# Patient Record
Sex: Male | Born: 1955 | Hispanic: Refuse to answer | Marital: Single
Health system: Southern US, Community
[De-identification: ages and names within clinical notes are randomized; demographics above are authoritative.]

---

## 2008-09-25 ENCOUNTER — Inpatient Hospital Stay (HOSPITAL_COMMUNITY): Admission: EM | Admit: 2008-09-25 | Discharge: 2008-10-01 | Payer: Self-pay | Admitting: Emergency Medicine

## 2008-09-27 ENCOUNTER — Other Ambulatory Visit: Payer: Self-pay | Admitting: Family Medicine

## 2010-08-04 LAB — BASIC METABOLIC PANEL
BUN: 7 mg/dL (ref 6–23)
BUN: 8 mg/dL (ref 6–23)
CO2: 27 mEq/L (ref 19–32)
CO2: 27 mEq/L (ref 19–32)
Calcium: 8.3 mg/dL — ABNORMAL LOW (ref 8.4–10.5)
Calcium: 8.8 mg/dL (ref 8.4–10.5)
Calcium: 8.8 mg/dL (ref 8.4–10.5)
Calcium: 8.9 mg/dL (ref 8.4–10.5)
Chloride: 101 mEq/L (ref 96–112)
Creatinine, Ser: 0.94 mg/dL (ref 0.4–1.5)
Creatinine, Ser: 0.98 mg/dL (ref 0.4–1.5)
Creatinine, Ser: 1.03 mg/dL (ref 0.4–1.5)
GFR calc Af Amer: >60 mL/min (ref >60)
GFR calc Af Amer: >60 mL/min (ref >60)
GFR calc Af Amer: >60 mL/min (ref >60)
GFR calc non Af Amer: >60 mL/min (ref >60)
GFR calc non Af Amer: >60 mL/min (ref >60)
Glucose, Bld: 113 mg/dL — ABNORMAL HIGH (ref 70–99)
Potassium: 3.4 mEq/L — ABNORMAL LOW (ref 3.5–5.1)
Sodium: 136 mEq/L (ref 135–145)
Sodium: 140 mEq/L (ref 135–145)

## 2010-08-04 LAB — CULTURE, BLOOD (ROUTINE X 2): Culture: NO GROWTH

## 2010-08-04 LAB — DIFFERENTIAL
Basophils Absolute: 0 10*3/uL (ref 0.0–0.1)
Basophils Absolute: 0 10*3/uL (ref 0.0–0.1)
Basophils Absolute: 0 10*3/uL (ref 0.0–0.1)
Basophils Relative: 0 % (ref 0–1)
Basophils Relative: 0 % (ref 0–1)
Basophils Relative: 0 % (ref 0–1)
Eosinophils Absolute: 0.1 10*3/uL (ref 0.0–0.7)
Eosinophils Absolute: 0.1 10*3/uL (ref 0.0–0.7)
Eosinophils Absolute: 0.3 10*3/uL (ref 0.0–0.7)
Eosinophils Relative: 2 % (ref 0–5)
Eosinophils Relative: 4 % (ref 0–5)
Lymphs Abs: 2.4 10*3/uL (ref 0.7–4.0)
Monocytes Absolute: 0.9 10*3/uL (ref 0.1–1.0)
Monocytes Absolute: 1.5 10*3/uL — ABNORMAL HIGH (ref 0.1–1.0)
Monocytes Relative: 9 % (ref 3–12)
Monocytes Relative: 9 % (ref 3–12)
Neutro Abs: 14 10*3/uL — ABNORMAL HIGH (ref 1.7–7.7)
Neutro Abs: 4.1 10*3/uL (ref 1.7–7.7)
Neutro Abs: 8.7 10*3/uL — ABNORMAL HIGH (ref 1.7–7.7)
Neutrophils Relative %: 65 % (ref 43–77)
Neutrophils Relative %: 75 % (ref 43–77)

## 2010-08-04 LAB — CBC
HCT: 30.7 % — ABNORMAL LOW (ref 39.0–52.0)
HCT: 33.1 % — ABNORMAL LOW (ref 39.0–52.0)
Hemoglobin: 10.1 g/dL — ABNORMAL LOW (ref 13.0–17.0)
Hemoglobin: 11.3 g/dL — ABNORMAL LOW (ref 13.0–17.0)
Hemoglobin: 12.7 g/dL — ABNORMAL LOW (ref 13.0–17.0)
MCHC: 33.5 g/dL (ref 30.0–36.0)
MCHC: 33.6 g/dL (ref 30.0–36.0)
MCHC: 34 g/dL (ref 30.0–36.0)
MCV: 90.8 fL (ref 78.0–100.0)
MCV: 91.8 fL (ref 78.0–100.0)
MCV: 91.8 fL (ref 78.0–100.0)
RBC: 3.34 MIL/uL — ABNORMAL LOW (ref 4.22–5.81)
RBC: 3.53 MIL/uL — ABNORMAL LOW (ref 4.22–5.81)
RBC: 3.62 MIL/uL — ABNORMAL LOW (ref 4.22–5.81)
RBC: 4.13 MIL/uL — ABNORMAL LOW (ref 4.22–5.81)
RDW: 14.4 % (ref 11.5–15.5)
RDW: 14.4 % (ref 11.5–15.5)
RDW: 14.8 % (ref 11.5–15.5)
RDW: 15.2 % (ref 11.5–15.5)
WBC: 10.1 10*3/uL (ref 4.0–10.5)
WBC: 14.6 10*3/uL — ABNORMAL HIGH (ref 4.0–10.5)

## 2010-08-04 LAB — CULTURE, ROUTINE-ABSCESS

## 2010-08-04 LAB — POCT I-STAT, CHEM 8
Chloride: 103 mEq/L (ref 96–112)
HCT: 40 % (ref 39.0–52.0)
Hemoglobin: 13.6 g/dL (ref 13.0–17.0)
Potassium: 3.7 mEq/L (ref 3.5–5.1)
Sodium: 142 mEq/L (ref 135–145)

## 2010-08-04 LAB — COMPREHENSIVE METABOLIC PANEL
ALT: 71 U/L — ABNORMAL HIGH (ref 0–53)
Alkaline Phosphatase: 75 U/L (ref 39–117)
CO2: 28 mEq/L (ref 19–32)
Chloride: 108 mEq/L (ref 96–112)
Glucose, Bld: 124 mg/dL — ABNORMAL HIGH (ref 70–99)
Potassium: 3.8 mEq/L (ref 3.5–5.1)
Sodium: 143 mEq/L (ref 135–145)
Total Bilirubin: 1.3 mg/dL — ABNORMAL HIGH (ref 0.3–1.2)
Total Protein: 6.4 g/dL (ref 6.0–8.3)

## 2010-08-04 LAB — ANAEROBIC CULTURE

## 2010-09-09 NOTE — H&P (Signed)
Randy Pierce, Randy Pierce NO.:  [id number]   MEDICAL RECORD NO.:  [id number]          PATIENT TYPE:  INP   LOCATION:  2610                         FACILITY:  [facility]   PHYSICIAN:  Lonia Blood, M.D.       DATE OF BIRTH:  03-04-1956   DATE OF ADMISSION:  09/25/2008  DATE OF DISCHARGE:                              HISTORY & PHYSICAL   PRIMARY CARE PHYSICIAN:  Dr. Pasty Arch.   CHIEF COMPLAINT:  Right jaw swelling.   HISTORY OF PRESENT ILLNESS:  [name] is a 55 year old gentleman  without any significant past medical history who presents with 24 hours  of worsening right facial swelling.  Everything started with a toothache  over the weekend and the patient saw his dentist who prescribed  cephalexin.  This morning though his jaw was extremely swollen and the  patient was found to be tachycardic, so he was sent over here for  admission.   PAST MEDICAL HISTORY:  1. Hyperlipidemia.  2. Gout.  3. Hypertension.   HOME MEDICATIONS:  Norvasc, lisinopril, simvastatin, and allopurinol.   SOCIAL HISTORY:  The patient is single.  No tobacco, alcohol, or drugs.  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   FAMILY HISTORY:  Mother is deceased with congestive heart failure.  Father died with heart attack.   REVIEW OF SYSTEMS:  Positive for some fevers, chills.  All other systems  reviewed and negative.   PHYSICAL EXAMINATION:  VITAL SIGNS:  Upon admission, temperature 100.8,  heart rate 123, respiratory rate 18, blood pressure 125/82, saturation  96% on room air.  GENERAL:  The patient is awake, alert, in no acute distress.  HEENT:  His right side of the face has significant edema and erythema.  No pus draining.  There is erythema extending on the right side of the  neck.  There is no significant tenderness or fluctuation appreciated.  Throat seems to be clear.   CARDIOVASCULAR:  Tachycardic and regular without lower extremity edema.  RESPIRATORY:  Clear to  auscultation without wheeze, rhonchi, or  crackles.  ABDOMEN:  Soft, nontender, nondistended.  Bowel sounds are present.  LOWER EXTREMITIES:  Without edema.  PSYCHIATRIC:  Alert and oriented, pleasant.   LABORATORY VALUES ON ADMISSION:  White blood cell count 17,000,  hemoglobin 12, platelet count 228.  Sodium 142, potassium 3.7, chloride  102, bicarbonate 27, BUN 14, creatinine 1.2, glucose 117.  CT scan of  the face shows extensive cellulitis with a 1.7-cm abscess in the  masseter muscle.   ASSESSMENT/PLAN:  1. This is a 55 year old gentleman with tachycardia, leukocytosis,      masseter muscle abscess, and cellulitis, probably with a point of      entry being the right molar abscess.  The patient will be started      on intravenous fluids, intravenous Unasyn, and consultation with      Dr. Narda Bonds has been called for consideration of incision and      drainage.  The patient will be observed in step-down unit overnight      to assure that he  does not develop sepsis.  2. Hypertension.  For now, we will hold his medications until his      blood pressure becomes more stable.  3. Hyperlipidemia.  We will continue statin.      Lonia Blood, M.D.  Electronically Signed     SL/MEDQ  D:  09/25/2008  T:  09/26/2008  Job:  [id number]   cc:   Pasty Arch, MD

## 2010-09-09 NOTE — Op Note (Signed)
NAME:  Randy Pierce, Randy Pierce NO.:  [id number]   MEDICAL RECORD NO.:  [id number]          PATIENT TYPE:  INP   LOCATION:  5002                         FACILITY:  [facility]   PHYSICIAN:  Grant Ruts., D.D.S.DATE OF BIRTH:  1955/06/25   DATE OF PROCEDURE:  09/27/2008  DATE OF DISCHARGE:                               OPERATIVE REPORT   PREOPERATIVE DIAGNOSIS:  Right submandibular space and masseteric space  abscess secondary to dental abscess of tooth #31.   POSTOPERATIVE DIAGNOSIS:  Right submandibular space and masseteric space  abscess secondary to dental abscess of tooth #31.   TYPE OF PROCEDURE:  Right submandibular and masseteric space extraoral  incision and drainage and placement of Penrose drain, culture  sensitivity, and extraction of tooth #31.   SURGEON:  Gwendlyn Deutscher, DDS   INDICATION FOR PROCEDURE:  This 55 year old male who had suddenly  developed swelling in his right face and right submandibular area  approximately 4 days ago.  He was admitted to the [facility] hospital on September 25, 2008, and started on IV antibiotics, Unasyn and had showed some mild  improvement.  X-ray examination showed he had right submandibular and  right masseteric space abscess and the cellulitis secondary to grossly  carious tooth #31 and a periapical radiolucency and abscess of tooth  #31.  A treatment plan was formulated for removal of tooth #31 and  incision and drainage extraorally.   PROCEDURE AND FINDINGS:  The patient was brought to the operating suite.  He was sedated with Fentanyl and Versed.  After satisfactory level of  sedation, the right submandibular area was prepped with Betadine scrub  and Betadine paint, and the patient with  draped with 4 towels, split sheet, and a head sheet.  Using 2% Xylocaine  with 1:100,000 epinephrine, right submandibular and right inferior  alveolar and lingual nerve block was achieved with local anesthesia and  then infiltration in the  right submandibular area with 2% Xylocaine with  1:100,000 epinephrine.  The right cheek was extended in right  submandibular space in the most dependent area and incision was made  approximately 1-1/2 to 2 inches long to the skin and subcutaneous  tissue, very moderate amount of bleeding was noted.  A hemostat was then  used to bluntly dissect superiorly and medially to locate the body of  the mandible and a pocket of purulent exudate was encountered and  copious amount of purulent exudate was elicited from the wound.  A  culture was taken for anaerobic and aerobic cultures and sent to the lab  for analysis and sensitivity.   The submandibular wound was dissected up to the lateral border of the  mandible and proximal area of the tooth #31 and then dissection was  carried out on the medial aspect of the mandible using blunt dissection.  The wound was thoroughly irrigated with a Betadine solution followed by  saline solution.  A 0.25-inch Penrose drain was then placed in the  incision on the lateral border of the mandible and sutured to the wound  edge extra-orally with 3-0 silk suture.  Bleeding was controlled with  compression and gauze.   Attention was then carried out infra-orally and using a 15 scalpel  blade, incision was made in the gingival tissue associated with tooth  #31.  A full-thickness mucoperiosteal flap was elevated and then using a  151 forceps, tooth #31 was surgically removed.  The tooth was noted to  be grossly carious and the entire tooth was removed without  complication.   After removal of the tooth, the socket was thoroughly irrigated with  saline solution and the wound was closed with a 3-0 gut suture.  The  socket was left open for drainage in this area.   Using two gauze folded over, the bleeding was easily controlled and the  area was packed.  A dressing was placed extra-orally and minimal  bleeding was noted at the end of the procedure.  Additional gauze  were  placed over the extraoral incision and drainage and a dressing was  placed.   TISSUE REMOVED:  Tooth #31.   DRAINS:  A 0.25-inch Penrose drain placed at the right submandibular  area.  Suture was gut suture.   CULTURES:  Anaerobic and aerobic cultures of the wound were completed  and of the abscess.   CONDITION:  The patient was taken to recovery room in stable condition,  tolerating the surgery and the sedation and local anesthesia well.      Grant Ruts., D.D.S.  Electronically Signed     WB/MEDQ  D:  09/27/2008  T:  09/28/2008  Job:  [id number]

## 2010-09-09 NOTE — Consult Note (Signed)
NAME:  Randy Pierce, Randy Pierce NO.:  [id number]   MEDICAL RECORD NO.:  [id number]          PATIENT TYPE:  INP   LOCATION:  5002                         FACILITY:  [facility]   PHYSICIAN:  Kristine Garbe. Ezzard Standing, M.D.DATE OF BIRTH:  Aug 20, 1955   DATE OF CONSULTATION:  09/26/2008  DATE OF DISCHARGE:                                 CONSULTATION   REASON FOR CONSULT:  Evaluate the patient with right facial abscess.   BRIEF HISTORY:  Mr. Torrence is a 55 year old gentleman who is admitted  via the emergency room with a right masseter muscle abscess infection.  This began initially last week with some pain in the right lower wisdom  tooth region.  He was started on antibiotics and was seen by a local  dentist to consider extraction of the tooth.  But when he presented to  dentist, he had diffuse swelling of the face and was referred to the  emergency room.  In the emergency room at [facility], the patient had a CT  scan which showed diffuse cellulitis of the right facial area as well as  cellulitis and small abscesses involving the right masseter muscle.  On  examination, the patient has a moderate trismus and he has diffuse  swelling of the right side of his face.   IMPRESSION:  Microabscesses of the right masseter muscle originating  from dental infection of the right lower wisdom tooth.  Reviewed the CT  scan with the radiologist.  He is not sure if there is anything that is  definite to drain at this point.  Would recommend intravenous  antibiotics cover intraoral infection to include anaerobic infection.  If the patient does not have significant improvement after 48 hours of  intravenous antibiotics, would repeat the CT scan to see if this abscess  consolidates or enlarges and if this worsens plan intraoral drainage.  However, the patient would also benefit from oral surgery consult to see  if extraction of the wisdom tooth when this will begin may help relieve  and decompress the  infection along with intravenous antibiotics.  Discussed plan of care with Dr. Lavera Guise who will contact an oral surgeon  for evaluation of possible dental extraction.  We will follow up in the  next 24 hours for recheck.           ______________________________  Kristine Garbe. Ezzard Standing, M.D.     CEN/MEDQ  D:  09/26/2008  T:  09/27/2008  Job:  [id number]

## 2010-09-09 NOTE — Discharge Summary (Signed)
NAME:  Randy Pierce, Randy Pierce NO.:  [id number]   MEDICAL RECORD NO.:  [id number]          PATIENT TYPE:  INP   LOCATION:  5002                         FACILITY:  [facility]   PHYSICIAN:  Ramiro Harvest, MD    DATE OF BIRTH:  1955-05-16   DATE OF ADMISSION:  09/25/2008  DATE OF DISCHARGE:  10/01/2008                               DISCHARGE SUMMARY   PRIMARY CARE PHYSICIAN:  Chales Salmon. Abigail Miyamoto, M.D. of [facility].   DISCHARGE DIAGNOSES:  1. A right submandibular space and mesenteric space abscess secondary      to a dental abscess of the tooth status post I&D and placement of      Penrose drain and extraction of tooth number 31 on September 27, 2008.  2. Tachycardia secondary to #1, resolved.  3. Hypernatremia, resolved.  4. Hypertension.  5. Gout.  6. History of hyperlipidemia.   DISCHARGE MEDICATIONS:  1. Allopurinol 300 mg p.o. daily.  2. Lisinopril 20 mg p.o. daily.  3. Simvastatin 80 mg p.o. daily.  4. Vicodin 5/500 one tablet p.o. q.4h. p.r.n. as previously taken.  5. Norvasc 5 mg p.o. daily.   DISPOSITION AND FOLLOW UP:  The patient will be discharged home.  The  patient is to follow up with Dr. Manson Passey as soon as he is discharged from  the hospital for further evaluation and follow-up, and it will be  decided per oral surgery as to whether the patient needs any further  antibiotics.  Cultures will need to be followed up upon per oral  surgeon.   CONSULTATIONS DONE:  1. ENT consult was done.  The patient was seen in consultation by Dr.      Ezzard Standing on September 26, 2008.  2. Oral surgery consult was done.  The patient was seen by Dr. Gwendlyn Deutscher, Montez Hageman. on September 27, 2008.   PROCEDURES PERFORMED:  1. CT of maxillofacial was done on September 25, 2008 which showed extensive      swelling of the right face compatible with infection.  There is      enlargement of the right mass of the muscle with several areas of      abscess in the right masseter muscle.  This is most  likely due to      dental infection.  There is lucency around the right upper molar      which may be the source of infection.  An orthopantogram was done      on September 26, 2008 that showed caries and peri root lucency affecting      the remaining right mandibular molar.  A right submandibular      mesenteric space extraoral incision and drainage and placement of      Penrose drain. culture and sensitivity, and extraction of tooth #31      was done on September 27, 2008 by Dr. Gwendlyn Deutscher, Montez Hageman.   BRIEF ADMISSION HISTORY AND PHYSICAL:  [name] is a 55-year-  old gentleman without any significant past medical history who presented  with 24 hours of worsening right facial swelling.  Everything started  with a toothache over the weekend, and the patient saw Dr. Renato Gails who had  given him some Keflex.  On the morning of admission, the patient's jaw  was extremely swollen, and the patient was found to be tachycardiac, so  he was sent over to the ED for admission.   PHYSICAL EXAMINATION PER ADMITTING PHYSICIAN:  VITAL SIGNS:  Temperature  of 100.8, heart rate of 123, respiratory rate 18, blood pressure 125/82,  satting 96% on room air.  GENERAL:  The patient is awake, alert, in no acute distress.  HEENT:  Right side of the face with significant edema and erythema.  No  pus is draining.  There is erythema extending on the right side of the  neck.  There is no significant tenderness or fluctuation appreciated.  Throat seems clear.  CARDIOVASCULAR:  Tachycardiac, regular without lower extremity edema.  RESPIRATORY:  Lungs are clear to auscultation without wheeze, rhonchi or  crackles.  ABDOMEN:  Soft, nontender, nondistended, positive bowel sounds.  EXTREMITIES:  No clubbing, cyanosis or edema.  NEUROLOGIC:  The patient is alert and oriented x3.  Cranial nerves II-  XII are grossly intact.  No focal deficits.   ADMISSION LABORATORIES:  CBC - white count 17,000, hemoglobin 12,  platelet  count of 228, sodium of 142, potassium 3.7, chloride 102,  bicarbonate 27, BUN 14, creatinine 1.2, glucose 117.  CT as stated  above.   HOSPITAL COURSE:  1. Right submandibular space and mesenteric space abscess secondary to      dental abscess of the tooth.  The patient was admitted initially      with this right facial cellulitis/abscess.  He was placed on      empiric IV antibiotics of IV Unasyn.  He was also placed on IV      fluids.  ENT was consulted.  Dr. Ezzard Standing saw the patient in      consultation on September 26, 2008.  It was felt by ENT that the patient      would benefit from an oral surgery consult to see whether      extraction of the wisdom tooth may help alleviate the patient's      cellulitis and facial infection.  The patient was followed.  A      consultation was done with Dr. Gwendlyn Deutscher of oral surgery who      consulted on the patient on September 27, 2008.  The patient was taken to      the operating room where he had a right submandibular mesenteric      cyst space extraoral incision and drainage and placement of a      Penrose drain with culture and sensitivity and extraction of tooth      #31.  The patient was maintained on IV fluids.  He was monitored.      The patient improved symptomatically with decrease in swelling of      his face.  IV Flagyl was added to the patient's regimen.  The      patient's drain was followed.  There was decreased drainage from      the patient's drain during the hospitalization.  The patient's      improved symptomatically on a daily basis, such that by day of      discharge the patient was in stable and improved condition.  The      patient will be discharged to follow up with Dr. Gwendlyn Deutscher  right after discharge for further recommendations concerning this      abscess.  It will be decided per Dr. Manson Passey as to whether the      patient needs any more days of IV antibiotics.  The patient was      treated for a total of 5 days of IV  antibiotics during the      hospitalization.  The patient remained afebrile and had a decrease      in his white count.  The patient will be discharged in stable and      improved condition.  2. Tachycardia.  The patient was tachycardiac on admission.  It was      felt the patient's tachycardia was likely secondary to problem      number one.  The patient was monitored with improvement with his      tachycardia during the hospitalization such that by day of      discharge the patient's tachycardia had resolved.  3. Hypernatremia.  During the hospitalization, the patient will was      noted to have an episode of hypernatremia with a sodium of 146.      The patient's IV fluids were changed to D5W with resolution of his      hypernatremia.   The rest of the patient's chronic medical issues remained stable  throughout the hospitalization, and the patient will be discharged in  stable and improved condition.  Blood cultures obtained during the  hospitalization were negative x2.   DISCHARGE VITAL SIGNS:  Temperature 98.5, pulse of 92, blood pressure  118/87, respiratory rate 20, satting 100% on room air.   DISCHARGE LABORATORIES:  Sodium 144, potassium 4.0, chloride 110,  bicarbonate 30, BUN 7, creatinine 1.03, glucose of 128 and a calcium of  8.8.   It was a pleasure taking care of [name].      Ramiro Harvest, MD  Electronically Signed     DT/MEDQ  D:  10/01/2008  T:  10/01/2008  Job:  [id number]   cc:   Chales Salmon. Abigail Miyamoto, M.D.  Grant Ruts., D.D.S.  Kristine Garbe. Ezzard Standing, M.D.  Dr. Renato Gails [facility]), [facility]

## 2010-09-15 ENCOUNTER — Other Ambulatory Visit: Payer: Self-pay | Admitting: Family Medicine

## 2010-09-15 DIAGNOSIS — G119 Hereditary ataxia, unspecified: Secondary | ICD-10-CM

## 2013-10-10 ENCOUNTER — Ambulatory Visit: Payer: Self-pay

## 2013-10-17 ENCOUNTER — Ambulatory Visit: Payer: Self-pay

## 2013-10-24 ENCOUNTER — Ambulatory Visit: Payer: Self-pay

## 2017-06-02 DIAGNOSIS — G629 Polyneuropathy, unspecified: Secondary | ICD-10-CM | POA: Diagnosis not present

## 2017-06-02 DIAGNOSIS — I10 Essential (primary) hypertension: Secondary | ICD-10-CM | POA: Diagnosis not present

## 2017-06-02 DIAGNOSIS — E7800 Pure hypercholesterolemia, unspecified: Secondary | ICD-10-CM | POA: Diagnosis not present

## 2017-06-02 DIAGNOSIS — Z125 Encounter for screening for malignant neoplasm of prostate: Secondary | ICD-10-CM | POA: Diagnosis not present

## 2017-06-02 DIAGNOSIS — M109 Gout, unspecified: Secondary | ICD-10-CM | POA: Diagnosis not present

## 2017-06-02 DIAGNOSIS — E1142 Type 2 diabetes mellitus with diabetic polyneuropathy: Secondary | ICD-10-CM | POA: Diagnosis not present

## 2018-07-11 DIAGNOSIS — I10 Essential (primary) hypertension: Secondary | ICD-10-CM | POA: Diagnosis not present

## 2018-07-11 DIAGNOSIS — E1142 Type 2 diabetes mellitus with diabetic polyneuropathy: Secondary | ICD-10-CM | POA: Diagnosis not present

## 2018-07-11 DIAGNOSIS — R05 Cough: Secondary | ICD-10-CM | POA: Diagnosis not present

## 2019-01-11 DIAGNOSIS — E1142 Type 2 diabetes mellitus with diabetic polyneuropathy: Secondary | ICD-10-CM | POA: Diagnosis not present

## 2019-01-11 DIAGNOSIS — E663 Overweight: Secondary | ICD-10-CM | POA: Diagnosis not present

## 2019-01-11 DIAGNOSIS — G629 Polyneuropathy, unspecified: Secondary | ICD-10-CM | POA: Diagnosis not present

## 2019-01-11 DIAGNOSIS — M109 Gout, unspecified: Secondary | ICD-10-CM | POA: Diagnosis not present

## 2019-01-16 DIAGNOSIS — E1142 Type 2 diabetes mellitus with diabetic polyneuropathy: Secondary | ICD-10-CM | POA: Diagnosis not present

## 2019-01-16 DIAGNOSIS — M109 Gout, unspecified: Secondary | ICD-10-CM | POA: Diagnosis not present

## 2019-01-16 DIAGNOSIS — Z23 Encounter for immunization: Secondary | ICD-10-CM | POA: Diagnosis not present

## 2019-03-31 ENCOUNTER — Other Ambulatory Visit: Payer: Self-pay

## 2019-03-31 DIAGNOSIS — Z20822 Contact with and (suspected) exposure to covid-19: Secondary | ICD-10-CM

## 2019-04-03 LAB — NOVEL CORONAVIRUS, NAA: SARS-CoV-2, NAA: NOT DETECTED

## 2019-06-05 ENCOUNTER — Ambulatory Visit: Payer: Medicare HMO

## 2019-06-30 ENCOUNTER — Ambulatory Visit: Payer: Medicare HMO

## 2019-07-19 DIAGNOSIS — I10 Essential (primary) hypertension: Secondary | ICD-10-CM | POA: Diagnosis not present

## 2019-07-19 DIAGNOSIS — E7800 Pure hypercholesterolemia, unspecified: Secondary | ICD-10-CM | POA: Diagnosis not present

## 2019-07-19 DIAGNOSIS — E1142 Type 2 diabetes mellitus with diabetic polyneuropathy: Secondary | ICD-10-CM | POA: Diagnosis not present

## 2019-08-23 DIAGNOSIS — E7800 Pure hypercholesterolemia, unspecified: Secondary | ICD-10-CM | POA: Diagnosis not present

## 2019-08-23 DIAGNOSIS — E1142 Type 2 diabetes mellitus with diabetic polyneuropathy: Secondary | ICD-10-CM | POA: Diagnosis not present

## 2019-08-23 DIAGNOSIS — I10 Essential (primary) hypertension: Secondary | ICD-10-CM | POA: Diagnosis not present

## 2019-10-17 DIAGNOSIS — Z1159 Encounter for screening for other viral diseases: Secondary | ICD-10-CM | POA: Diagnosis not present

## 2019-10-17 DIAGNOSIS — E7800 Pure hypercholesterolemia, unspecified: Secondary | ICD-10-CM | POA: Diagnosis not present

## 2019-10-17 DIAGNOSIS — E1142 Type 2 diabetes mellitus with diabetic polyneuropathy: Secondary | ICD-10-CM | POA: Diagnosis not present

## 2019-10-17 DIAGNOSIS — Z125 Encounter for screening for malignant neoplasm of prostate: Secondary | ICD-10-CM | POA: Diagnosis not present

## 2019-10-17 DIAGNOSIS — M109 Gout, unspecified: Secondary | ICD-10-CM | POA: Diagnosis not present

## 2019-10-17 DIAGNOSIS — I10 Essential (primary) hypertension: Secondary | ICD-10-CM | POA: Diagnosis not present

## 2019-10-17 DIAGNOSIS — G629 Polyneuropathy, unspecified: Secondary | ICD-10-CM | POA: Diagnosis not present

## 2019-10-24 DIAGNOSIS — G629 Polyneuropathy, unspecified: Secondary | ICD-10-CM | POA: Diagnosis not present

## 2019-10-24 DIAGNOSIS — Z125 Encounter for screening for malignant neoplasm of prostate: Secondary | ICD-10-CM | POA: Diagnosis not present

## 2019-10-24 DIAGNOSIS — M109 Gout, unspecified: Secondary | ICD-10-CM | POA: Diagnosis not present

## 2019-10-24 DIAGNOSIS — E1142 Type 2 diabetes mellitus with diabetic polyneuropathy: Secondary | ICD-10-CM | POA: Diagnosis not present

## 2019-10-24 DIAGNOSIS — I10 Essential (primary) hypertension: Secondary | ICD-10-CM | POA: Diagnosis not present

## 2019-10-24 DIAGNOSIS — E7800 Pure hypercholesterolemia, unspecified: Secondary | ICD-10-CM | POA: Diagnosis not present

## 2019-10-24 DIAGNOSIS — Z1159 Encounter for screening for other viral diseases: Secondary | ICD-10-CM | POA: Diagnosis not present

## 2019-10-25 DIAGNOSIS — I10 Essential (primary) hypertension: Secondary | ICD-10-CM | POA: Diagnosis not present

## 2019-11-14 DIAGNOSIS — Z135 Encounter for screening for eye and ear disorders: Secondary | ICD-10-CM | POA: Diagnosis not present

## 2019-11-14 DIAGNOSIS — E7800 Pure hypercholesterolemia, unspecified: Secondary | ICD-10-CM | POA: Diagnosis not present

## 2019-11-14 DIAGNOSIS — I10 Essential (primary) hypertension: Secondary | ICD-10-CM | POA: Diagnosis not present

## 2019-11-14 DIAGNOSIS — Z0100 Encounter for examination of eyes and vision without abnormal findings: Secondary | ICD-10-CM | POA: Diagnosis not present

## 2019-11-14 DIAGNOSIS — H5210 Myopia, unspecified eye: Secondary | ICD-10-CM | POA: Diagnosis not present

## 2019-11-14 DIAGNOSIS — E119 Type 2 diabetes mellitus without complications: Secondary | ICD-10-CM | POA: Diagnosis not present

## 2019-11-23 DIAGNOSIS — I10 Essential (primary) hypertension: Secondary | ICD-10-CM | POA: Diagnosis not present

## 2019-11-25 DIAGNOSIS — I10 Essential (primary) hypertension: Secondary | ICD-10-CM | POA: Diagnosis not present

## 2019-12-17 DIAGNOSIS — Z1211 Encounter for screening for malignant neoplasm of colon: Secondary | ICD-10-CM | POA: Diagnosis not present

## 2019-12-28 DIAGNOSIS — E1142 Type 2 diabetes mellitus with diabetic polyneuropathy: Secondary | ICD-10-CM | POA: Diagnosis not present

## 2019-12-28 DIAGNOSIS — I10 Essential (primary) hypertension: Secondary | ICD-10-CM | POA: Diagnosis not present

## 2019-12-28 DIAGNOSIS — E7800 Pure hypercholesterolemia, unspecified: Secondary | ICD-10-CM | POA: Diagnosis not present

## 2020-04-16 DIAGNOSIS — E1142 Type 2 diabetes mellitus with diabetic polyneuropathy: Secondary | ICD-10-CM | POA: Diagnosis not present

## 2020-04-16 DIAGNOSIS — E7800 Pure hypercholesterolemia, unspecified: Secondary | ICD-10-CM | POA: Diagnosis not present

## 2020-04-16 DIAGNOSIS — I10 Essential (primary) hypertension: Secondary | ICD-10-CM | POA: Diagnosis not present

## 2020-07-05 DIAGNOSIS — G629 Polyneuropathy, unspecified: Secondary | ICD-10-CM | POA: Diagnosis not present

## 2020-07-05 DIAGNOSIS — E7800 Pure hypercholesterolemia, unspecified: Secondary | ICD-10-CM | POA: Diagnosis not present

## 2020-07-05 DIAGNOSIS — Z125 Encounter for screening for malignant neoplasm of prostate: Secondary | ICD-10-CM | POA: Diagnosis not present

## 2020-07-05 DIAGNOSIS — E1142 Type 2 diabetes mellitus with diabetic polyneuropathy: Secondary | ICD-10-CM | POA: Diagnosis not present

## 2020-07-05 DIAGNOSIS — M109 Gout, unspecified: Secondary | ICD-10-CM | POA: Diagnosis not present

## 2020-07-05 DIAGNOSIS — I10 Essential (primary) hypertension: Secondary | ICD-10-CM | POA: Diagnosis not present

## 2020-07-17 DIAGNOSIS — I10 Essential (primary) hypertension: Secondary | ICD-10-CM | POA: Diagnosis not present

## 2020-07-17 DIAGNOSIS — E1142 Type 2 diabetes mellitus with diabetic polyneuropathy: Secondary | ICD-10-CM | POA: Diagnosis not present

## 2020-07-17 DIAGNOSIS — E7800 Pure hypercholesterolemia, unspecified: Secondary | ICD-10-CM | POA: Diagnosis not present

## 2020-07-25 DIAGNOSIS — I10 Essential (primary) hypertension: Secondary | ICD-10-CM | POA: Diagnosis not present

## 2020-08-23 DIAGNOSIS — I10 Essential (primary) hypertension: Secondary | ICD-10-CM | POA: Diagnosis not present

## 2020-09-24 DIAGNOSIS — I10 Essential (primary) hypertension: Secondary | ICD-10-CM | POA: Diagnosis not present

## 2020-10-24 DIAGNOSIS — I10 Essential (primary) hypertension: Secondary | ICD-10-CM | POA: Diagnosis not present

## 2020-11-21 DIAGNOSIS — I10 Essential (primary) hypertension: Secondary | ICD-10-CM | POA: Diagnosis not present

## 2021-01-07 DIAGNOSIS — Z23 Encounter for immunization: Secondary | ICD-10-CM | POA: Diagnosis not present

## 2021-01-13 ENCOUNTER — Other Ambulatory Visit (HOSPITAL_BASED_OUTPATIENT_CLINIC_OR_DEPARTMENT_OTHER): Payer: Self-pay

## 2021-01-15 ENCOUNTER — Other Ambulatory Visit (HOSPITAL_BASED_OUTPATIENT_CLINIC_OR_DEPARTMENT_OTHER): Payer: Self-pay

## 2021-01-15 ENCOUNTER — Ambulatory Visit: Payer: Medicare HMO | Attending: Internal Medicine

## 2021-01-15 DIAGNOSIS — Z23 Encounter for immunization: Secondary | ICD-10-CM

## 2021-01-15 MED ORDER — PFIZER COVID-19 VAC BIVALENT 30 MCG/0.3ML IM SUSP
INTRAMUSCULAR | 0 refills | Status: AC
  Filled 2021-01-15: qty 0.3, 1d supply, fill #0

## 2021-01-15 NOTE — Progress Notes (Signed)
   Covid-19 Vaccination Clinic  Name:  Randy Pierce    MRN: [id number] DOB: 05/25/1955  01/15/2021  [name] was observed post Covid-19 immunization for 15 minutes without incident. He was provided with Vaccine Information Sheet and instruction to access the V-Safe system.   [name] was instructed to call 911 with any severe reactions post vaccine: Difficulty breathing  Swelling of face and throat  A fast heartbeat  A bad rash all over body  Dizziness and weakness

## 2021-01-23 DIAGNOSIS — E119 Type 2 diabetes mellitus without complications: Secondary | ICD-10-CM | POA: Diagnosis not present

## 2021-01-23 DIAGNOSIS — H5200 Hypermetropia, unspecified eye: Secondary | ICD-10-CM | POA: Diagnosis not present

## 2021-01-23 DIAGNOSIS — I10 Essential (primary) hypertension: Secondary | ICD-10-CM | POA: Diagnosis not present

## 2021-01-23 DIAGNOSIS — E7800 Pure hypercholesterolemia, unspecified: Secondary | ICD-10-CM | POA: Diagnosis not present

## 2021-01-23 DIAGNOSIS — Z0100 Encounter for examination of eyes and vision without abnormal findings: Secondary | ICD-10-CM | POA: Diagnosis not present

## 2021-02-27 DIAGNOSIS — E1142 Type 2 diabetes mellitus with diabetic polyneuropathy: Secondary | ICD-10-CM | POA: Diagnosis not present

## 2021-02-27 DIAGNOSIS — E7800 Pure hypercholesterolemia, unspecified: Secondary | ICD-10-CM | POA: Diagnosis not present

## 2021-02-27 DIAGNOSIS — I10 Essential (primary) hypertension: Secondary | ICD-10-CM | POA: Diagnosis not present

## 2021-04-02 DIAGNOSIS — Z1211 Encounter for screening for malignant neoplasm of colon: Secondary | ICD-10-CM | POA: Diagnosis not present

## 2021-04-08 DIAGNOSIS — R635 Abnormal weight gain: Secondary | ICD-10-CM | POA: Diagnosis not present

## 2021-04-08 DIAGNOSIS — E7800 Pure hypercholesterolemia, unspecified: Secondary | ICD-10-CM | POA: Diagnosis not present

## 2021-04-08 DIAGNOSIS — G629 Polyneuropathy, unspecified: Secondary | ICD-10-CM | POA: Diagnosis not present

## 2021-04-08 DIAGNOSIS — M109 Gout, unspecified: Secondary | ICD-10-CM | POA: Diagnosis not present

## 2021-04-08 DIAGNOSIS — Z125 Encounter for screening for malignant neoplasm of prostate: Secondary | ICD-10-CM | POA: Diagnosis not present

## 2021-04-08 DIAGNOSIS — I10 Essential (primary) hypertension: Secondary | ICD-10-CM | POA: Diagnosis not present

## 2021-04-08 DIAGNOSIS — E1142 Type 2 diabetes mellitus with diabetic polyneuropathy: Secondary | ICD-10-CM | POA: Diagnosis not present

## 2021-04-08 DIAGNOSIS — M791 Myalgia, unspecified site: Secondary | ICD-10-CM | POA: Diagnosis not present

## 2021-04-30 DIAGNOSIS — I10 Essential (primary) hypertension: Secondary | ICD-10-CM | POA: Diagnosis not present

## 2021-04-30 DIAGNOSIS — E7800 Pure hypercholesterolemia, unspecified: Secondary | ICD-10-CM | POA: Diagnosis not present

## 2021-04-30 DIAGNOSIS — E1142 Type 2 diabetes mellitus with diabetic polyneuropathy: Secondary | ICD-10-CM | POA: Diagnosis not present

## 2021-07-29 DIAGNOSIS — M25561 Pain in right knee: Secondary | ICD-10-CM | POA: Diagnosis not present

## 2021-07-29 DIAGNOSIS — M25562 Pain in left knee: Secondary | ICD-10-CM | POA: Diagnosis not present

## 2021-08-01 ENCOUNTER — Other Ambulatory Visit (HOSPITAL_COMMUNITY): Payer: Self-pay

## 2021-10-10 DIAGNOSIS — E7800 Pure hypercholesterolemia, unspecified: Secondary | ICD-10-CM | POA: Diagnosis not present

## 2021-10-10 DIAGNOSIS — E1142 Type 2 diabetes mellitus with diabetic polyneuropathy: Secondary | ICD-10-CM | POA: Diagnosis not present

## 2021-10-10 DIAGNOSIS — M109 Gout, unspecified: Secondary | ICD-10-CM | POA: Diagnosis not present

## 2021-10-10 DIAGNOSIS — G629 Polyneuropathy, unspecified: Secondary | ICD-10-CM | POA: Diagnosis not present

## 2021-10-10 DIAGNOSIS — Z125 Encounter for screening for malignant neoplasm of prostate: Secondary | ICD-10-CM | POA: Diagnosis not present

## 2021-10-10 DIAGNOSIS — I10 Essential (primary) hypertension: Secondary | ICD-10-CM | POA: Diagnosis not present

## 2022-01-09 DIAGNOSIS — Z23 Encounter for immunization: Secondary | ICD-10-CM | POA: Diagnosis not present

## 2022-04-08 DIAGNOSIS — G629 Polyneuropathy, unspecified: Secondary | ICD-10-CM | POA: Diagnosis not present

## 2022-04-08 DIAGNOSIS — E1142 Type 2 diabetes mellitus with diabetic polyneuropathy: Secondary | ICD-10-CM | POA: Diagnosis not present

## 2022-04-08 DIAGNOSIS — Z6830 Body mass index (BMI) 30.0-30.9, adult: Secondary | ICD-10-CM | POA: Diagnosis not present

## 2022-10-14 DIAGNOSIS — Z6830 Body mass index (BMI) 30.0-30.9, adult: Secondary | ICD-10-CM | POA: Diagnosis not present

## 2022-10-14 DIAGNOSIS — E1142 Type 2 diabetes mellitus with diabetic polyneuropathy: Secondary | ICD-10-CM | POA: Diagnosis not present

## 2022-10-14 DIAGNOSIS — Z125 Encounter for screening for malignant neoplasm of prostate: Secondary | ICD-10-CM | POA: Diagnosis not present

## 2022-10-14 DIAGNOSIS — I10 Essential (primary) hypertension: Secondary | ICD-10-CM | POA: Diagnosis not present

## 2022-10-14 DIAGNOSIS — Z0000 Encounter for general adult medical examination without abnormal findings: Secondary | ICD-10-CM | POA: Diagnosis not present

## 2022-10-14 DIAGNOSIS — M109 Gout, unspecified: Secondary | ICD-10-CM | POA: Diagnosis not present

## 2022-10-14 DIAGNOSIS — E7800 Pure hypercholesterolemia, unspecified: Secondary | ICD-10-CM | POA: Diagnosis not present

## 2023-04-14 DIAGNOSIS — I10 Essential (primary) hypertension: Secondary | ICD-10-CM | POA: Diagnosis not present

## 2023-04-14 DIAGNOSIS — E669 Obesity, unspecified: Secondary | ICD-10-CM | POA: Diagnosis not present

## 2023-04-14 DIAGNOSIS — G629 Polyneuropathy, unspecified: Secondary | ICD-10-CM | POA: Diagnosis not present

## 2023-04-14 DIAGNOSIS — E1142 Type 2 diabetes mellitus with diabetic polyneuropathy: Secondary | ICD-10-CM | POA: Diagnosis not present

## 2023-10-12 DIAGNOSIS — G629 Polyneuropathy, unspecified: Secondary | ICD-10-CM | POA: Diagnosis not present

## 2023-10-12 DIAGNOSIS — E1142 Type 2 diabetes mellitus with diabetic polyneuropathy: Secondary | ICD-10-CM | POA: Diagnosis not present

## 2023-10-12 DIAGNOSIS — Z125 Encounter for screening for malignant neoplasm of prostate: Secondary | ICD-10-CM | POA: Diagnosis not present

## 2023-10-12 DIAGNOSIS — Z6831 Body mass index (BMI) 31.0-31.9, adult: Secondary | ICD-10-CM | POA: Diagnosis not present

## 2023-10-12 DIAGNOSIS — Z23 Encounter for immunization: Secondary | ICD-10-CM | POA: Diagnosis not present

## 2023-10-12 DIAGNOSIS — E7800 Pure hypercholesterolemia, unspecified: Secondary | ICD-10-CM | POA: Diagnosis not present

## 2023-10-12 DIAGNOSIS — I10 Essential (primary) hypertension: Secondary | ICD-10-CM | POA: Diagnosis not present

## 2023-10-12 DIAGNOSIS — Z0000 Encounter for general adult medical examination without abnormal findings: Secondary | ICD-10-CM | POA: Diagnosis not present

## 2023-10-12 DIAGNOSIS — M109 Gout, unspecified: Secondary | ICD-10-CM | POA: Diagnosis not present

## 2023-11-11 DIAGNOSIS — H5203 Hypermetropia, bilateral: Secondary | ICD-10-CM | POA: Diagnosis not present

## 2023-11-11 DIAGNOSIS — H52223 Regular astigmatism, bilateral: Secondary | ICD-10-CM | POA: Diagnosis not present

## 2023-11-11 DIAGNOSIS — H524 Presbyopia: Secondary | ICD-10-CM | POA: Diagnosis not present

## 2023-11-16 DIAGNOSIS — H52223 Regular astigmatism, bilateral: Secondary | ICD-10-CM | POA: Diagnosis not present

## 2023-11-16 DIAGNOSIS — H524 Presbyopia: Secondary | ICD-10-CM | POA: Diagnosis not present

## 2023-12-02 DIAGNOSIS — M1712 Unilateral primary osteoarthritis, left knee: Secondary | ICD-10-CM | POA: Diagnosis not present

## 2024-04-05 DIAGNOSIS — M109 Gout, unspecified: Secondary | ICD-10-CM | POA: Diagnosis not present

## 2024-04-05 DIAGNOSIS — G629 Polyneuropathy, unspecified: Secondary | ICD-10-CM | POA: Diagnosis not present

## 2024-04-05 DIAGNOSIS — Z6831 Body mass index (BMI) 31.0-31.9, adult: Secondary | ICD-10-CM | POA: Diagnosis not present

## 2024-04-05 DIAGNOSIS — I10 Essential (primary) hypertension: Secondary | ICD-10-CM | POA: Diagnosis not present

## 2024-04-05 DIAGNOSIS — E1142 Type 2 diabetes mellitus with diabetic polyneuropathy: Secondary | ICD-10-CM | POA: Diagnosis not present
